# Patient Record
Sex: Female | Born: 1980
Health system: Southern US, Community
[De-identification: ages and names within clinical notes are randomized; demographics above are authoritative.]

## PROBLEM LIST (undated history)

## (undated) DIAGNOSIS — F419 Anxiety disorder, unspecified: Secondary | ICD-10-CM

## (undated) DIAGNOSIS — N809 Endometriosis, unspecified: Secondary | ICD-10-CM

## (undated) HISTORY — PX: DILATION AND CURETTAGE OF UTERUS: SHX78

## (undated) HISTORY — PX: LAPAROSCOPY: SHX197

---

## 2017-11-29 DIAGNOSIS — L579 Skin changes due to chronic exposure to nonionizing radiation, unspecified: Secondary | ICD-10-CM | POA: Diagnosis not present

## 2017-11-29 DIAGNOSIS — D1801 Hemangioma of skin and subcutaneous tissue: Secondary | ICD-10-CM | POA: Diagnosis not present

## 2017-11-29 DIAGNOSIS — D225 Melanocytic nevi of trunk: Secondary | ICD-10-CM | POA: Diagnosis not present

## 2017-11-29 DIAGNOSIS — L821 Other seborrheic keratosis: Secondary | ICD-10-CM | POA: Diagnosis not present

## 2017-11-29 DIAGNOSIS — L814 Other melanin hyperpigmentation: Secondary | ICD-10-CM | POA: Diagnosis not present

## 2017-11-29 DIAGNOSIS — D485 Neoplasm of uncertain behavior of skin: Secondary | ICD-10-CM | POA: Diagnosis not present

## 2018-03-29 DIAGNOSIS — Z0000 Encounter for general adult medical examination without abnormal findings: Secondary | ICD-10-CM | POA: Diagnosis not present

## 2018-04-04 DIAGNOSIS — E782 Mixed hyperlipidemia: Secondary | ICD-10-CM | POA: Diagnosis not present

## 2018-04-04 DIAGNOSIS — Z0000 Encounter for general adult medical examination without abnormal findings: Secondary | ICD-10-CM | POA: Diagnosis not present

## 2018-04-19 DIAGNOSIS — Z01419 Encounter for gynecological examination (general) (routine) without abnormal findings: Secondary | ICD-10-CM | POA: Diagnosis not present

## 2018-07-11 DIAGNOSIS — D225 Melanocytic nevi of trunk: Secondary | ICD-10-CM | POA: Diagnosis not present

## 2018-07-11 DIAGNOSIS — L821 Other seborrheic keratosis: Secondary | ICD-10-CM | POA: Diagnosis not present

## 2018-07-11 DIAGNOSIS — D485 Neoplasm of uncertain behavior of skin: Secondary | ICD-10-CM | POA: Diagnosis not present

## 2018-07-11 DIAGNOSIS — L579 Skin changes due to chronic exposure to nonionizing radiation, unspecified: Secondary | ICD-10-CM | POA: Diagnosis not present

## 2018-07-11 DIAGNOSIS — B372 Candidiasis of skin and nail: Secondary | ICD-10-CM | POA: Diagnosis not present

## 2018-07-11 DIAGNOSIS — L814 Other melanin hyperpigmentation: Secondary | ICD-10-CM | POA: Diagnosis not present

## 2018-11-29 MED FILL — LORazepam 1 MG TABS: 1 | 8 days supply | Qty: 30 | Fill #0

## 2019-01-09 DIAGNOSIS — D1801 Hemangioma of skin and subcutaneous tissue: Secondary | ICD-10-CM | POA: Diagnosis not present

## 2019-01-09 DIAGNOSIS — D2271 Melanocytic nevi of right lower limb, including hip: Secondary | ICD-10-CM | POA: Diagnosis not present

## 2019-01-09 DIAGNOSIS — D2262 Melanocytic nevi of left upper limb, including shoulder: Secondary | ICD-10-CM | POA: Diagnosis not present

## 2019-01-09 DIAGNOSIS — L579 Skin changes due to chronic exposure to nonionizing radiation, unspecified: Secondary | ICD-10-CM | POA: Diagnosis not present

## 2019-01-09 DIAGNOSIS — D225 Melanocytic nevi of trunk: Secondary | ICD-10-CM | POA: Diagnosis not present

## 2019-01-09 DIAGNOSIS — D2272 Melanocytic nevi of left lower limb, including hip: Secondary | ICD-10-CM | POA: Diagnosis not present

## 2019-01-09 DIAGNOSIS — D2261 Melanocytic nevi of right upper limb, including shoulder: Secondary | ICD-10-CM | POA: Diagnosis not present

## 2019-01-09 DIAGNOSIS — L720 Epidermal cyst: Secondary | ICD-10-CM | POA: Diagnosis not present

## 2019-03-07 MED FILL — LORazepam 1 MG TABS: 1 | 8 days supply | Qty: 30 | Fill #0

## 2019-05-02 DIAGNOSIS — Z30432 Encounter for removal of intrauterine contraceptive device: Secondary | ICD-10-CM | POA: Diagnosis not present

## 2019-05-02 DIAGNOSIS — F419 Anxiety disorder, unspecified: Secondary | ICD-10-CM | POA: Diagnosis not present

## 2019-05-02 DIAGNOSIS — Z3041 Encounter for surveillance of contraceptive pills: Secondary | ICD-10-CM | POA: Diagnosis not present

## 2019-05-02 DIAGNOSIS — Z01419 Encounter for gynecological examination (general) (routine) without abnormal findings: Secondary | ICD-10-CM | POA: Diagnosis not present

## 2019-05-03 MED FILL — LO LOESTRIN FE 1-10 TABLET: 1 MG-10 MCG | 28 days supply | Qty: 28 | Fill #0

## 2019-05-03 MED FILL — LORAZEPAM 1 MG TABS: 1 | 7 days supply | Qty: 30 | Fill #0

## 2019-06-19 MED FILL — LO LOESTRIN FE 1-10 TABLET: 1 MG-10 MCG | 28 days supply | Qty: 28 | Fill #0

## 2019-06-19 MED FILL — LORAZEPAM 1 MG TABS: 1 | 7 days supply | Qty: 30 | Fill #0

## 2019-07-17 DIAGNOSIS — L579 Skin changes due to chronic exposure to nonionizing radiation, unspecified: Secondary | ICD-10-CM | POA: Diagnosis not present

## 2019-07-17 DIAGNOSIS — D225 Melanocytic nevi of trunk: Secondary | ICD-10-CM | POA: Diagnosis not present

## 2019-07-17 DIAGNOSIS — D2262 Melanocytic nevi of left upper limb, including shoulder: Secondary | ICD-10-CM | POA: Diagnosis not present

## 2019-07-17 DIAGNOSIS — D2261 Melanocytic nevi of right upper limb, including shoulder: Secondary | ICD-10-CM | POA: Diagnosis not present

## 2019-07-17 DIAGNOSIS — L821 Other seborrheic keratosis: Secondary | ICD-10-CM | POA: Diagnosis not present

## 2019-07-17 DIAGNOSIS — D2271 Melanocytic nevi of right lower limb, including hip: Secondary | ICD-10-CM | POA: Diagnosis not present

## 2019-07-17 DIAGNOSIS — L814 Other melanin hyperpigmentation: Secondary | ICD-10-CM | POA: Diagnosis not present

## 2019-07-17 DIAGNOSIS — D1801 Hemangioma of skin and subcutaneous tissue: Secondary | ICD-10-CM | POA: Diagnosis not present

## 2019-08-02 DIAGNOSIS — Z13220 Encounter for screening for lipoid disorders: Secondary | ICD-10-CM | POA: Diagnosis not present

## 2019-08-02 DIAGNOSIS — E559 Vitamin D deficiency, unspecified: Secondary | ICD-10-CM | POA: Diagnosis not present

## 2019-08-02 DIAGNOSIS — E782 Mixed hyperlipidemia: Secondary | ICD-10-CM | POA: Diagnosis not present

## 2019-08-02 DIAGNOSIS — Z13228 Encounter for screening for other metabolic disorders: Secondary | ICD-10-CM | POA: Diagnosis not present

## 2019-08-02 DIAGNOSIS — Z1329 Encounter for screening for other suspected endocrine disorder: Secondary | ICD-10-CM | POA: Diagnosis not present

## 2019-08-14 DIAGNOSIS — Z0000 Encounter for general adult medical examination without abnormal findings: Secondary | ICD-10-CM | POA: Diagnosis not present

## 2019-08-14 DIAGNOSIS — E559 Vitamin D deficiency, unspecified: Secondary | ICD-10-CM | POA: Diagnosis not present

## 2019-08-14 DIAGNOSIS — J301 Allergic rhinitis due to pollen: Secondary | ICD-10-CM | POA: Diagnosis not present

## 2019-08-14 DIAGNOSIS — E782 Mixed hyperlipidemia: Secondary | ICD-10-CM | POA: Diagnosis not present

## 2019-08-17 MED FILL — LO LOESTRIN FE 1-10 TABLET: 1 MG-10 MCG | 28 days supply | Qty: 28 | Fill #1

## 2019-08-27 MED FILL — LO LOESTRIN FE 1-10 TABLET: 1 MG-10 MCG | 28 days supply | Qty: 28 | Fill #1

## 2019-09-27 MED FILL — LO LOESTRIN FE 1-10 TABLET: 1 MG-10 MCG | 28 days supply | Qty: 28 | Fill #2

## 2019-10-05 MED FILL — LO LOESTRIN FE 1-10 TABLET: 1 MG-10 MCG | 28 days supply | Qty: 28 | Fill #2

## 2019-11-05 MED FILL — LO LOESTRIN FE 1-10 TABLET: 1 MG-10 MCG | 28 days supply | Qty: 28 | Fill #3

## 2019-11-28 MED FILL — LO LOESTRIN FE 1-10 TABLET: 1 MG-10 MCG | 28 days supply | Qty: 28 | Fill #4

## 2019-12-21 MED FILL — LO LOESTRIN FE 1-10 TABLET: 1 MG-10 MCG | 28 days supply | Qty: 28 | Fill #5

## 2020-01-04 DIAGNOSIS — M545 Low back pain: Secondary | ICD-10-CM | POA: Diagnosis not present

## 2020-01-04 DIAGNOSIS — M47818 Spondylosis without myelopathy or radiculopathy, sacral and sacrococcygeal region: Secondary | ICD-10-CM | POA: Diagnosis not present

## 2020-01-04 DIAGNOSIS — M546 Pain in thoracic spine: Secondary | ICD-10-CM | POA: Diagnosis not present

## 2020-01-04 DIAGNOSIS — G8929 Other chronic pain: Secondary | ICD-10-CM | POA: Diagnosis not present

## 2020-01-04 MED FILL — predniSONE 20 MG TABS: 20 | 9 days supply | Qty: 9 | Fill #0

## 2020-01-04 MED FILL — METHOCARBAMOL 500 MG TABS: 500 | 10 days supply | Qty: 40 | Fill #0

## 2020-01-24 MED FILL — LO LOESTRIN FE 1-10 TABLET: 1 MG-10 MCG | 28 days supply | Qty: 28 | Fill #6

## 2020-02-25 MED FILL — LO LOESTRIN FE 1-10 TABLET: 1 MG-10 MCG | 28 days supply | Qty: 28 | Fill #7

## 2020-03-11 DIAGNOSIS — D225 Melanocytic nevi of trunk: Secondary | ICD-10-CM | POA: Diagnosis not present

## 2020-03-11 DIAGNOSIS — L579 Skin changes due to chronic exposure to nonionizing radiation, unspecified: Secondary | ICD-10-CM | POA: Diagnosis not present

## 2020-03-11 DIAGNOSIS — L814 Other melanin hyperpigmentation: Secondary | ICD-10-CM | POA: Diagnosis not present

## 2020-03-11 DIAGNOSIS — D1801 Hemangioma of skin and subcutaneous tissue: Secondary | ICD-10-CM | POA: Diagnosis not present

## 2020-03-11 DIAGNOSIS — L821 Other seborrheic keratosis: Secondary | ICD-10-CM | POA: Diagnosis not present

## 2020-03-21 MED FILL — LO LOESTRIN FE 1-10 TABLET: 1 MG-10 MCG | 28 days supply | Qty: 28 | Fill #8

## 2020-04-10 DIAGNOSIS — H52223 Regular astigmatism, bilateral: Secondary | ICD-10-CM | POA: Diagnosis not present

## 2020-04-10 DIAGNOSIS — H5203 Hypermetropia, bilateral: Secondary | ICD-10-CM | POA: Diagnosis not present

## 2020-04-21 MED FILL — LO LOESTRIN FE 1-10 TABLET: 1 MG-10 MCG | 28 days supply | Qty: 28 | Fill #9

## 2020-05-14 MED FILL — LO LOESTRIN FE 1-10 TABLET: 1 MG-10 MCG | 28 days supply | Qty: 28 | Fill #0

## 2020-06-09 ENCOUNTER — Other Ambulatory Visit (HOSPITAL_BASED_OUTPATIENT_CLINIC_OR_DEPARTMENT_OTHER): Payer: Self-pay | Admitting: Obstetrics & Gynecology

## 2020-06-09 DIAGNOSIS — F419 Anxiety disorder, unspecified: Secondary | ICD-10-CM | POA: Diagnosis not present

## 2020-06-09 DIAGNOSIS — Z3040 Encounter for surveillance of contraceptives, unspecified: Secondary | ICD-10-CM | POA: Diagnosis not present

## 2020-06-09 DIAGNOSIS — Z01419 Encounter for gynecological examination (general) (routine) without abnormal findings: Secondary | ICD-10-CM | POA: Diagnosis not present

## 2020-06-09 MED FILL — LO LOESTRIN FE 1-10 TABLET: 1 MG-10 MCG | 28 days supply | Qty: 28 | Fill #0

## 2020-06-09 MED FILL — LORAZEPAM 1 MG TABS: 1 | 7 days supply | Qty: 30 | Fill #0

## 2020-07-11 MED FILL — LO LOESTRIN FE 1-10 TABLET: 1 MG-10 MCG | 28 days supply | Qty: 28 | Fill #1

## 2020-08-11 MED FILL — LO LOESTRIN FE 1-10 TABLET: 1 MG-10 MCG | 28 days supply | Qty: 28 | Fill #2

## 2020-08-22 DIAGNOSIS — R5383 Other fatigue: Secondary | ICD-10-CM | POA: Diagnosis not present

## 2020-08-22 DIAGNOSIS — Z20822 Contact with and (suspected) exposure to covid-19: Secondary | ICD-10-CM | POA: Diagnosis not present

## 2020-08-24 DIAGNOSIS — Z20822 Contact with and (suspected) exposure to covid-19: Secondary | ICD-10-CM | POA: Diagnosis not present

## 2020-08-24 DIAGNOSIS — J069 Acute upper respiratory infection, unspecified: Secondary | ICD-10-CM | POA: Diagnosis not present

## 2020-09-03 MED FILL — LO LOESTRIN FE 1-10 TABLET: 1 MG-10 MCG | 28 days supply | Qty: 28 | Fill #3

## 2020-10-06 MED FILL — LO LOESTRIN FE 1-10 TABLET: 1 MG-10 MCG | 28 days supply | Qty: 28 | Fill #4

## 2020-10-06 MED FILL — LORazepam 1 MG TABS: 1 | 7 days supply | Qty: 30 | Fill #1

## 2020-10-21 ENCOUNTER — Other Ambulatory Visit (HOSPITAL_BASED_OUTPATIENT_CLINIC_OR_DEPARTMENT_OTHER): Payer: Self-pay | Admitting: Family Medicine

## 2020-10-21 MED FILL — MELOXICAM 15 MG TABLET: 15 | 30 days supply | Qty: 30 | Fill #0

## 2020-11-05 MED FILL — LORazepam 1 MG TABS: 1 | 7 days supply | Qty: 30 | Fill #2

## 2020-11-05 MED FILL — LO LOESTRIN FE 1-10 TABLET: 1 MG-10 MCG | 28 days supply | Qty: 28 | Fill #5

## 2020-11-17 MED FILL — MELOXICAM 15 MG TABLET: 15 | 30 days supply | Qty: 30 | Fill #1

## 2020-12-01 MED FILL — LORazepam 1 MG TABS: 1 | 7 days supply | Qty: 30 | Fill #3

## 2020-12-01 MED FILL — LO LOESTRIN FE 1-10 TABLET: 1 MG-10 MCG | 28 days supply | Qty: 28 | Fill #6

## 2020-12-18 MED FILL — MELOXICAM 15 MG TABLET: 15 | 30 days supply | Qty: 30 | Fill #2

## 2020-12-29 MED FILL — LO LOESTRIN FE 1-10 TABLET: 1 MG-10 MCG | 28 days supply | Qty: 28 | Fill #7

## 2021-01-10 ENCOUNTER — Encounter (HOSPITAL_BASED_OUTPATIENT_CLINIC_OR_DEPARTMENT_OTHER): Payer: Self-pay | Admitting: *Deleted

## 2021-01-10 ENCOUNTER — Emergency Department (HOSPITAL_BASED_OUTPATIENT_CLINIC_OR_DEPARTMENT_OTHER)
Admission: EM | Admit: 2021-01-10 | Discharge: 2021-01-10 | Disposition: A | Discharge status: Home / Self Care | Payer: 59 | Attending: Emergency Medicine | Admitting: Emergency Medicine

## 2021-01-10 ENCOUNTER — Emergency Department (HOSPITAL_BASED_OUTPATIENT_CLINIC_OR_DEPARTMENT_OTHER): Payer: 59

## 2021-01-10 ENCOUNTER — Other Ambulatory Visit: Payer: Self-pay

## 2021-01-10 DIAGNOSIS — S4992XA Unspecified injury of left shoulder and upper arm, initial encounter: Secondary | ICD-10-CM

## 2021-01-10 DIAGNOSIS — W19XXXA Unspecified fall, initial encounter: Secondary | ICD-10-CM

## 2021-01-10 DIAGNOSIS — Y92481 Parking lot as the place of occurrence of the external cause: Secondary | ICD-10-CM | POA: Insufficient documentation

## 2021-01-10 DIAGNOSIS — Y9355 Activity, bike riding: Secondary | ICD-10-CM | POA: Diagnosis not present

## 2021-01-10 DIAGNOSIS — R202 Paresthesia of skin: Secondary | ICD-10-CM | POA: Diagnosis not present

## 2021-01-10 DIAGNOSIS — R000 Tachycardia, unspecified: Secondary | ICD-10-CM | POA: Insufficient documentation

## 2021-01-10 HISTORY — DX: Endometriosis, unspecified: N80.9

## 2021-01-10 HISTORY — DX: Anxiety disorder, unspecified: F41.9

## 2021-01-10 MED ORDER — KETOROLAC TROMETHAMINE 60 MG/2ML IM SOLN
60.0000 mg | Freq: Once | INTRAMUSCULAR | Status: AC
  Administered 2021-01-10: 60 mg via INTRAMUSCULAR
  Filled 2021-01-10: qty 2

## 2021-01-10 MED ORDER — CYCLOBENZAPRINE HCL 5 MG PO TABS: 5.0000 mg | ORAL_TABLET | Freq: Three times a day (TID) | ORAL | 0 refills | Status: DC | PRN

## 2021-01-10 NOTE — ED Notes (Signed)
Medicated for pain, ice pack also provided, pt repositioned for comfort
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
## 2021-01-25 ENCOUNTER — Other Ambulatory Visit (HOSPITAL_BASED_OUTPATIENT_CLINIC_OR_DEPARTMENT_OTHER): Payer: Self-pay

## 2021-01-25 MED FILL — Norethin-Eth Estradiol-Fe Tab 1 MG-10 MCG (24)/10 MCG (2): ORAL | 84 days supply | Qty: 84 | Fill #0 | Status: AC

## 2021-01-26 ENCOUNTER — Other Ambulatory Visit (HOSPITAL_BASED_OUTPATIENT_CLINIC_OR_DEPARTMENT_OTHER): Payer: Self-pay

## 2021-02-03 ENCOUNTER — Other Ambulatory Visit (HOSPITAL_BASED_OUTPATIENT_CLINIC_OR_DEPARTMENT_OTHER): Payer: Self-pay

## 2021-04-13 ENCOUNTER — Other Ambulatory Visit (HOSPITAL_BASED_OUTPATIENT_CLINIC_OR_DEPARTMENT_OTHER): Payer: Self-pay

## 2021-04-13 MED ORDER — LO LOESTRIN FE 1 MG-10 MCG / 10 MCG PO TABS
1.0000 | ORAL_TABLET | Freq: Every day | ORAL | 0 refills | Status: DC
  Filled 2021-04-13: qty 84, 84d supply, fill #0

## 2021-04-13 MED FILL — Norethin-Eth Estradiol-Fe Tab 1 MG-10 MCG (24)/10 MCG (2): ORAL | 28 days supply | Qty: 28 | Fill #1 | Status: CN

## 2021-04-14 ENCOUNTER — Other Ambulatory Visit (HOSPITAL_BASED_OUTPATIENT_CLINIC_OR_DEPARTMENT_OTHER): Payer: Self-pay

## 2021-05-19 ENCOUNTER — Other Ambulatory Visit (HOSPITAL_BASED_OUTPATIENT_CLINIC_OR_DEPARTMENT_OTHER): Payer: Self-pay

## 2021-05-19 MED ORDER — KETOROLAC TROMETHAMINE 10 MG PO TABS
ORAL_TABLET | ORAL | 0 refills | Status: DC
  Filled 2021-05-19: qty 20, 5d supply, fill #0

## 2021-05-20 ENCOUNTER — Other Ambulatory Visit (HOSPITAL_BASED_OUTPATIENT_CLINIC_OR_DEPARTMENT_OTHER): Payer: Self-pay

## 2021-05-20 MED ORDER — HYDROMORPHONE HCL 2 MG PO TABS
ORAL_TABLET | ORAL | 0 refills | Status: DC
  Filled 2021-05-20: qty 20, 4d supply, fill #0
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
## 2024-06-28 ENCOUNTER — Ambulatory Visit
Admission: EM | Admit: 2024-06-28 | Discharge: 2024-06-28 | Disposition: A | Discharge status: Home / Self Care | Attending: Family Medicine | Admitting: Family Medicine

## 2024-06-28 ENCOUNTER — Other Ambulatory Visit: Payer: Self-pay

## 2024-06-28 DIAGNOSIS — R6889 Other general symptoms and signs: Secondary | ICD-10-CM | POA: Diagnosis not present

## 2024-06-28 LAB — POC SARS CORONAVIRUS 2 AG -  ED: SARS Coronavirus 2 Ag: NEGATIVE

## 2024-06-28 NOTE — ED Provider Notes (Signed)
 [facility]    CSN: [id number] Arrival date & time: 06/28/24  1524      History   Chief Complaint Chief Complaint  Patient presents with   Generalized Body Aches    HPI Ashley Stuart is a 43 y.o. female.   Patient is a Engineer, civil (consulting).  No known exposure to illness.  She states she had some sinus congestion and cold for couple of days then today developed profound fatigue, body weakness, and chest congestion.  Concern for COVID.  No recent travel.  No one else at home is sick.  Generally enjoys good health    Past Medical History:  Diagnosis Date   Anxiety    Endometriosis     There are no active problems to display for this patient.   Past Surgical History:  Procedure Laterality Date   DILATION AND CURETTAGE OF UTERUS     LAPAROSCOPY      OB History   No obstetric history on file.      Home Medications    Prior to Admission medications   Medication Sig Start Date End Date Taking? Authorizing Provider  cetirizine (ZYRTEC) 10 MG tablet Take 10 mg by mouth daily.    [provider]    Family History History reviewed. No pertinent family history.  Social History Social History   Tobacco Use   Smoking status: Never   Smokeless tobacco: Never  Vaping Use   Vaping status: Never Used  Substance Use Topics   Alcohol use: Yes    Comment: rare   Drug use: Never     Allergies   Hydrocodone, Oxycodone, Sulfa antibiotics, Tramadol, and Zithromax [azithromycin]   Review of Systems Review of Systems  See HPI Physical Exam Triage Vital Signs ED Triage Vitals  Encounter Vitals Group     BP 06/28/24 1538 (!) 132/91     Girls Systolic BP Percentile --      Girls Diastolic BP Percentile --      Boys Systolic BP Percentile --      Boys Diastolic BP Percentile --      Pulse Rate 06/28/24 1538 86     Resp 06/28/24 1538 16     Temp 06/28/24 1538 98.6 F (37 C)     Temp src --      SpO2 06/28/24 1538 97 %     Weight --      Height --       Head Circumference --      Peak Flow --      Pain Score 06/28/24 1541 5     Pain Loc --      Pain Education --      Exclude from Growth Chart --    No data found.  Updated Vital Signs BP (!) 132/91   Pulse 86   Temp 98.6 F (37 C)   Resp 16   LMP 06/18/2024 (Exact Date)   SpO2 97%      Physical Exam Constitutional:      General: She is not in acute distress.    Appearance: She is well-developed. She is ill-appearing.  HENT:     Head: Normocephalic and atraumatic.     Right Ear: Tympanic membrane normal.     Left Ear: Tympanic membrane normal.     Nose: Nose normal. No congestion.     Mouth/Throat:     Mouth: Mucous membranes are moist.     Pharynx: Posterior oropharyngeal erythema present.  Eyes:  Conjunctiva/sclera: Conjunctivae normal.     Pupils: Pupils are equal, round, and reactive to light.  Cardiovascular:     Rate and Rhythm: Normal rate and regular rhythm.     Heart sounds: Normal heart sounds.  Pulmonary:     Effort: Pulmonary effort is normal. No respiratory distress.     Breath sounds: Normal breath sounds.  Musculoskeletal:        General: Normal range of motion.     Cervical back: Normal range of motion.  Lymphadenopathy:     Cervical: No cervical adenopathy.  Skin:    General: Skin is warm and dry.  Neurological:     Mental Status: She is alert.      UC Treatments / Results  Labs (all labs ordered are listed, but only abnormal results are displayed) Labs Reviewed  POC SARS CORONAVIRUS 2 AG -  ED    EKG   Radiology No results found.  Procedures Procedures (including critical care time)  Medications Ordered in UC Medications - No data to display  Initial Impression / Assessment and Plan / UC Course  I have reviewed the triage vital signs and the nursing notes.  Pertinent labs & imaging results that were available during my care of the patient were reviewed by me and considered in my medical decision making (see chart for  details).     COVID is negative.  Home care for viral illnesses reviewed Final Clinical Impressions(s) / UC Diagnoses   Final diagnoses:  Flu-like symptoms     Discharge Instructions      Drink lots of fluids Take Tylenol or ibuprofen as needed for pain and fever May use over-the-counter cough or cold medicines as needed Call for problems   ED Prescriptions   None    PDMP not reviewed this encounter.   Maranda Jamee Jacob, MD 06/28/24 [phone]

## 2024-06-28 NOTE — Discharge Instructions (Signed)
 Drink lots of fluids Take Tylenol or ibuprofen as needed for pain and fever May use over-the-counter cough or cold medicines as needed Call for problems

## 2024-06-28 NOTE — ED Triage Notes (Signed)
 Fatigue, ha x 3 days. Today started having body aches, loss of appetite, nausea when eating, generalized body pain. Still has ha. This morning had nasal congestion. No fever. Has had motrin and tylenol.

## 2024-06-29 ENCOUNTER — Telehealth: Payer: Self-pay | Admitting: Emergency Medicine

## 2024-06-29 NOTE — Telephone Encounter (Signed)
 Spoke with patient, states that she has been resting and feeling somewhat better.  Will follow up as needed.
